# Patient Record
Sex: Male | Born: 1995 | Race: Black or African American | Hispanic: No | Marital: Single | Smoking: Current every day smoker
Health system: Southern US, Community
[De-identification: ages and names within clinical notes are randomized; demographics above are authoritative.]

## PROBLEM LIST (undated history)

## (undated) DIAGNOSIS — J45909 Unspecified asthma, uncomplicated: Secondary | ICD-10-CM

## (undated) HISTORY — DX: Unspecified asthma, uncomplicated: J45.909

---
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
## 2014-04-13 ENCOUNTER — Encounter (HOSPITAL_BASED_OUTPATIENT_CLINIC_OR_DEPARTMENT_OTHER): Payer: Self-pay | Admitting: Emergency Medicine

## 2014-04-13 ENCOUNTER — Emergency Department (HOSPITAL_BASED_OUTPATIENT_CLINIC_OR_DEPARTMENT_OTHER)
Admission: EM | Admit: 2014-04-13 | Discharge: 2014-04-13 | Disposition: A | Discharge status: Home / Self Care | Payer: Federal, State, Local not specified - PPO | Attending: Emergency Medicine | Admitting: Emergency Medicine

## 2014-04-13 ENCOUNTER — Emergency Department (HOSPITAL_BASED_OUTPATIENT_CLINIC_OR_DEPARTMENT_OTHER): Payer: Federal, State, Local not specified - PPO

## 2014-04-13 DIAGNOSIS — Y9389 Activity, other specified: Secondary | ICD-10-CM | POA: Insufficient documentation

## 2014-04-13 DIAGNOSIS — Z79899 Other long term (current) drug therapy: Secondary | ICD-10-CM | POA: Insufficient documentation

## 2014-04-13 DIAGNOSIS — R42 Dizziness and giddiness: Secondary | ICD-10-CM | POA: Insufficient documentation

## 2014-04-13 DIAGNOSIS — J45909 Unspecified asthma, uncomplicated: Secondary | ICD-10-CM | POA: Insufficient documentation

## 2014-04-13 DIAGNOSIS — M546 Pain in thoracic spine: Secondary | ICD-10-CM

## 2014-04-13 DIAGNOSIS — Y92410 Unspecified street and highway as the place of occurrence of the external cause: Secondary | ICD-10-CM | POA: Insufficient documentation

## 2014-04-13 DIAGNOSIS — S298XXA Other specified injuries of thorax, initial encounter: Secondary | ICD-10-CM | POA: Diagnosis not present

## 2014-04-13 DIAGNOSIS — IMO0002 Reserved for concepts with insufficient information to code with codable children: Secondary | ICD-10-CM | POA: Insufficient documentation

## 2014-04-13 DIAGNOSIS — R079 Chest pain, unspecified: Secondary | ICD-10-CM

## 2014-04-13 NOTE — Discharge Instructions (Signed)
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
## 2014-04-13 NOTE — ED Notes (Signed)
MD at bedside. 

## 2014-04-14 ENCOUNTER — Encounter (HOSPITAL_BASED_OUTPATIENT_CLINIC_OR_DEPARTMENT_OTHER): Payer: Self-pay | Admitting: Emergency Medicine

## 2014-04-14 ENCOUNTER — Emergency Department (HOSPITAL_BASED_OUTPATIENT_CLINIC_OR_DEPARTMENT_OTHER)
Admission: EM | Admit: 2014-04-14 | Discharge: 2014-04-14 | Disposition: A | Discharge status: Home / Self Care | Payer: Federal, State, Local not specified - PPO | Attending: Emergency Medicine | Admitting: Emergency Medicine

## 2014-04-14 DIAGNOSIS — Z79899 Other long term (current) drug therapy: Secondary | ICD-10-CM | POA: Insufficient documentation

## 2014-04-14 DIAGNOSIS — G8911 Acute pain due to trauma: Secondary | ICD-10-CM | POA: Diagnosis not present

## 2014-04-14 DIAGNOSIS — J45909 Unspecified asthma, uncomplicated: Secondary | ICD-10-CM | POA: Insufficient documentation

## 2014-04-14 DIAGNOSIS — R209 Unspecified disturbances of skin sensation: Secondary | ICD-10-CM | POA: Diagnosis present

## 2014-04-14 DIAGNOSIS — S161XXD Strain of muscle, fascia and tendon at neck level, subsequent encounter: Secondary | ICD-10-CM

## 2014-04-14 DIAGNOSIS — M542 Cervicalgia: Secondary | ICD-10-CM | POA: Insufficient documentation

## 2014-04-14 MED ORDER — CYCLOBENZAPRINE HCL 5 MG PO TABS: 5.0000 mg | ORAL_TABLET | Freq: Two times a day (BID) | ORAL | Status: DC | PRN
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
## 2014-04-14 NOTE — Discharge Instructions (Signed)
Cervical Sprain A cervical sprain is when the tissues (ligaments) that hold the neck bones in place stretch or tear. HOME CARE   Put ice on the injured area.  Put ice in a plastic bag.  Place a towel between your skin and the bag.  Leave the ice on for 15-20 minutes, 3-4 times a day.  You may have been given a collar to wear. This collar keeps your neck from moving while you heal.  Do not take the collar off unless told by your doctor.  If you have long hair, keep it outside of the collar.  Ask your doctor before changing the position of your collar. You may need to change its position over time to make it more comfortable.  If you are allowed to take off the collar for cleaning or bathing, follow your doctor's instructions on how to do it safely.  Keep your collar clean by wiping it with mild soap and water. Dry it completely. If the collar has removable pads, remove them every 1-2 days to hand wash them with soap and water. Allow them to air dry. They should be dry before you wear them in the collar.  Do not drive while wearing the collar.  Only take medicine as told by your doctor.  Keep all doctor visits as told.  Keep all physical therapy visits as told.  Adjust your work station so that you have good posture while you work.  Avoid positions and activities that make your problems worse.  Warm up and stretch before being active. GET HELP IF:  Your pain is not controlled with medicine.  You cannot take less pain medicine over time as planned.  Your activity level does not improve as expected. GET HELP RIGHT AWAY IF:   You are bleeding.  Your stomach is upset.  You have an allergic reaction to your medicine.  You develop new problems that you cannot explain.  You lose feeling (become numb) or you cannot move any part of your body (paralysis).  You have tingling or weakness in any part of your body.  Your symptoms get worse. Symptoms include:  Pain,  soreness, stiffness, puffiness (swelling), or a burning feeling in your neck.  Pain when your neck is touched.  Shoulder or upper back pain.  Limited ability to move your neck.  Headache.  Dizziness.  Your hands or arms feel week, lose feeling, or tingle.  Muscle spasms.  Difficulty swallowing or chewing. MAKE SURE YOU:   Understand these instructions.  Will watch your condition.  Will get help right away if you are not doing well or get worse. Document Released: 12/21/2007 Document Revised: 03/06/2013 Document Reviewed: 01/09/2013 Exit[employer] Patient Information 2015 [employer]. This information is not intended to replace advice given to you by your health care provider. Make sure you discuss any questions you have with your health care provider.  

## 2014-04-14 NOTE — ED Provider Notes (Signed)
Medical screening examination/treatment/procedure(s) were performed by non-physician practitioner and as supervising physician I was immediately available for consultation/collaboration.   EKG Interpretation None       Ethelda Chick, MD 04/14/14 1007

## 2014-04-14 NOTE — ED Notes (Addendum)
rx x 1 given for flexeril- follow up care discussed at length- referral given for Dr Pearletha Forge- pt verbalized understanding

## 2014-07-13 ENCOUNTER — Emergency Department (HOSPITAL_COMMUNITY): Payer: Federal, State, Local not specified - PPO

## 2014-07-13 ENCOUNTER — Emergency Department (HOSPITAL_COMMUNITY)
Admission: EM | Admit: 2014-07-13 | Discharge: 2014-07-14 | Disposition: A | Discharge status: Home / Self Care | Payer: Federal, State, Local not specified - PPO | Attending: Emergency Medicine | Admitting: Emergency Medicine

## 2014-07-13 ENCOUNTER — Encounter (HOSPITAL_COMMUNITY): Payer: Self-pay | Admitting: *Deleted

## 2014-07-13 DIAGNOSIS — S0267XA Fracture of alveolus of mandible, initial encounter for closed fracture: Secondary | ICD-10-CM | POA: Insufficient documentation

## 2014-07-13 DIAGNOSIS — J45909 Unspecified asthma, uncomplicated: Secondary | ICD-10-CM | POA: Diagnosis not present

## 2014-07-13 DIAGNOSIS — Y998 Other external cause status: Secondary | ICD-10-CM | POA: Insufficient documentation

## 2014-07-13 DIAGNOSIS — S0993XA Unspecified injury of face, initial encounter: Secondary | ICD-10-CM | POA: Diagnosis present

## 2014-07-13 DIAGNOSIS — S02609A Fracture of mandible, unspecified, initial encounter for closed fracture: Secondary | ICD-10-CM

## 2014-07-13 DIAGNOSIS — Y9289 Other specified places as the place of occurrence of the external cause: Secondary | ICD-10-CM | POA: Diagnosis not present

## 2014-07-13 DIAGNOSIS — Y9389 Activity, other specified: Secondary | ICD-10-CM | POA: Insufficient documentation

## 2014-07-13 DIAGNOSIS — R6884 Jaw pain: Secondary | ICD-10-CM | POA: Diagnosis not present

## 2014-07-13 MED ORDER — HYDROCODONE-ACETAMINOPHEN 5-325 MG PO TABS: 1.0000 | ORAL_TABLET | ORAL | Status: DC | PRN

## 2014-07-13 MED ORDER — HYDROCODONE-ACETAMINOPHEN 5-325 MG PO TABS
1.0000 | ORAL_TABLET | Freq: Once | ORAL | Status: AC
  Administered 2014-07-13: 1 via ORAL
  Filled 2014-07-13: qty 1
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
## 2014-07-13 NOTE — ED Notes (Signed)
Patient back from CT Patient remains in NAD 

## 2014-07-13 NOTE — Discharge Instructions (Signed)
Mandibular Fracture °A mandibular fracture is a break in the jawbone. °CAUSES  °The most common cause of mandibular fracture is a direct blow (trauma) to the jaw. This could happen from: °· A car crash. °· Physical violence. °· A fall from a high place. °SYMPTOMS  °· Pain. °· Swelling. °· Difficulty and pain when closing the mouth. °· Feeling that the teeth are not aligned properly when closing the mouth (malocclusion). °· Difficulty speaking. °· Difficulty swallowing. °DIAGNOSIS  °Your caregiver will take your history and perform a physical exam. He or she may also order imaging tests, such as X-rays or a computed tomography (CT) scan, to confirm your diagnosis. °TREATMENT  °Surgery is often needed to put the jaw back in the right position. Wires are usually placed around the teeth to hold the jaw in place while it heals. Treatment may also include pain medicine, ice, and a soft or liquid diet. °HOME CARE INSTRUCTIONS  °· Put ice on the injured area. °¨ Put ice in a plastic bag. °¨ Place a towel between your skin and the bag. °¨ Leave the ice on for 15-20 minutes, 03-04 times a day for the first 2 days. °· Only take over-the-counter or prescription medicines for pain, discomfort, or fever as directed by your caregiver. °· Eat a well-balanced, high-protein soft or liquid diet as directed by your caregiver. °· If your jaws are wired, follow your caregiver's instructions for wired jaw care. °· Sleep on your back to avoid putting pressure on your jaw. °· Avoid exercising to the point that you become short of breath. °SEEK MEDICAL CARE IF:  °· You have a severe headache or numbness in your face. °· You have severe jaw pain that is not relieved with medicine. °· Your jaw wires become loose. °· You have uncontrollable nausea or anxiety. °· Your swelling or redness gets worse. °SEEK IMMEDIATE MEDICAL CARE IF: °· You have a fever. °· You have difficulty breathing. °· You feel like your airway is tightening. °· You cannot  swallow your saliva. °· You make a high-pitched whistling sound when you breathe (wheezing). °MAKE SURE YOU:  °· Understand these instructions. °· Will watch your condition. °· Will get help right away if you are not doing well or get worse. °Document Released: 07/04/2005 Document Revised: 09/26/2011 Document Reviewed: 07/20/2011 °ExitCare® Patient Information ©2015 Exit[employer], LLC. This information is not intended to replace advice given to you by your health care provider. Make sure you discuss any questions you have with your health care provider. ° °
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
## 2014-07-13 NOTE — ED Notes (Signed)
Patient transported to CT 

## 2014-07-14 ENCOUNTER — Encounter (HOSPITAL_BASED_OUTPATIENT_CLINIC_OR_DEPARTMENT_OTHER): Payer: Self-pay

## 2014-07-14 ENCOUNTER — Emergency Department (HOSPITAL_BASED_OUTPATIENT_CLINIC_OR_DEPARTMENT_OTHER)
Admission: EM | Admit: 2014-07-14 | Discharge: 2014-07-14 | Disposition: A | Discharge status: Home / Self Care | Payer: Federal, State, Local not specified - PPO | Attending: Emergency Medicine | Admitting: Emergency Medicine

## 2014-07-14 DIAGNOSIS — Z8781 Personal history of (healed) traumatic fracture: Secondary | ICD-10-CM | POA: Diagnosis not present

## 2014-07-14 DIAGNOSIS — J45909 Unspecified asthma, uncomplicated: Secondary | ICD-10-CM | POA: Insufficient documentation

## 2014-07-14 DIAGNOSIS — R6884 Jaw pain: Secondary | ICD-10-CM | POA: Insufficient documentation

## 2014-07-14 DIAGNOSIS — G8911 Acute pain due to trauma: Secondary | ICD-10-CM | POA: Insufficient documentation

## 2014-07-14 MED ORDER — HYDROMORPHONE HCL 1 MG/ML IJ SOLN
1.0000 mg | Freq: Once | INTRAMUSCULAR | Status: AC
  Administered 2014-07-14: 1 mg via INTRAVENOUS
  Filled 2014-07-14: qty 1

## 2014-07-14 MED ORDER — HYDROCODONE-ACETAMINOPHEN 5-325 MG PO TABS
2.0000 | ORAL_TABLET | Freq: Once | ORAL | Status: AC
  Administered 2014-07-14: 2 via ORAL
  Filled 2014-07-14: qty 2

## 2014-07-14 NOTE — ED Notes (Signed)
Discharge instructions reviewed with patient--agrees and verbalized understanding Patient informed of need to make and keep follow up appointment--agrees and verbalized understanding DC Rx x 1 reviewed with patient--agrees and verbalized understanding VS updated and stable--reviewed with patient at time of DC--agrees and verbalized understanding Patient alert and oriented x 4 and in NAD at time of discharge All questions related to this ED visit, DC instructions, DC prescriptions and follow up care answered to patient's satisfaction by this nurse 
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
## 2014-07-14 NOTE — Discharge Instructions (Signed)
Follow-up with [facility]ology in the [facility]. [facility] at: Address: [address], [place], [place] [address] [phone]  Return to the ER with any worsening of symptoms, severe swelling, difficulty swallowing or breathing, high fever, nausea, vomiting.  Mandibular Fracture A mandibular fracture is a break in the jawbone. CAUSES  The most common cause of mandibular fracture is a direct blow (trauma) to the jaw. This could happen from:  A car crash.  Physical violence.  A fall from a high place. SYMPTOMS   Pain.  Swelling.  Difficulty and pain when closing the mouth.  Feeling that the teeth are not aligned properly when closing the mouth (malocclusion).  Difficulty speaking.  Difficulty swallowing. DIAGNOSIS  Your caregiver will take your history and perform a physical exam. He or she may also order imaging tests, such as X-rays or a computed tomography (CT) scan, to confirm your diagnosis. TREATMENT  Surgery is often needed to put the jaw back in the right position. Wires are usually placed around the teeth to hold the jaw in place while it heals. Treatment may also include pain medicine, ice, and a soft or liquid diet. HOME CARE INSTRUCTIONS   Put ice on the injured area.  Put ice in a plastic bag.  Place a towel between your skin and the bag.  Leave the ice on for 15-20 minutes, 03-04 times a day for the first 2 days.  Only take over-the-counter or prescription medicines for pain, discomfort, or fever as directed by your caregiver.  Eat a well-balanced, high-protein soft or liquid diet as directed by your caregiver.  If your jaws are wired, follow your caregiver's instructions for wired jaw care.  Sleep on your back to avoid putting pressure on your jaw.  Avoid exercising to the point that you become short of breath. SEEK MEDICAL CARE IF:   You have a severe headache or  numbness in your face.  You have severe jaw pain that is not relieved with medicine.  Your jaw wires become loose.  You have uncontrollable nausea or anxiety.  You  r swelling or redness gets worse. SEEK IMMEDIATE MEDICAL CARE IF:  You have a fever.  You have difficulty breathing.  You feel like your airway is tightening.  You cannot swallow your saliva.  You make a high-pitched whistling sound when you breathe (wheezing). MAKE SURE YOU:   Understand these instructions.  Will watch your condition.  Will get help right away if you are not doing well or get worse. Document Released: 07/04/2005 Document Revised: 09/26/2011 Document Reviewed: 07/20/2011 [facility] Patient Information 2015 [place], [place]. This information is not intended to replace advice given to you by your health care provider. Make sure you discuss any questions you have with your health care provider.  Fractured-Jaw Meal Plan The purpose of the fractured-jaw meal plan is to provide foods that can be easily blended and easily swallowed. This plan is typically used after jaw or mouth surgery, wired jaw surgery, or dental surgery. Foods in this plan need to be blended so that they can be sipped from a straw or given through a syringe. You should try to have at least three meals and three snacks daily. It is important to make sure you get enough calories and protein to prevent weight loss and help your body heal, especially after surgery. You may wish to include a liquid multivitamin in your plan to ensure that you get  all the vitamins and minerals you need. Ask your health care provider for a recommendation.  HOW DO I PREPARE MY MEALS? All foods in this plan must be blended. Avoid nuts, seeds, skins, peels, bones, or any foods that cannot be blended to the right consistency. Make sure to eat a variety of foods from each food group every day. The following tips can help you as you blend your food:  Remove skins,  seeds, and peels from food.  Cook meats and vegetables thoroughly.  Cut foods into small pieces and mix with a small amount of liquid in a food processor or blender. Continue to add liquid until the food becomes thin enough to sip through a straw.  Adding liquids such as juice, milk, cream, broth, gravy, or vegetable juice can help add flavor to foods.  Heat foods after they have been blended to reduce the amount of foam created from blending.  Heat or cool your foods to lukewarm temperatures if your teeth and mouth are sensitive to extreme temperatures. WHAT FOODS CAN I EAT? Make sure to eat a variety of foods from each food group.  Grains  Hot cereals, such as oatmeal, grits, ground wheat cereals, and polenta.  Rice and pasta.  Couscous. Vegetables  All cooked or canned vegetables, without seeds and skins.  Vegetable juices.  Cooked potatoes, without skins. Fruit  Any cooked or canned fruits, without seeds and skins.  Fresh, peeled soft fruits, such as bananas and peaches, that can be blended until smooth.  All fruit juices, without seeds and skins. Meat and Other Protein Sources  Soft-boiled eggs, scrambled eggs, powdered eggs, pasteurized egg mixtures, and custard.  Ground meats, such as hamburger, Mal[place]urkey, sausage, and meatloaf.  Tender, well-cooked meat, poultry, and fish prepared without bones or skin.  Soft soy foods (such as tofu).  Smooth nut butters. Dairy  All are allowed. Beverages  Coffee (regular or decaffeinated), tea, and mineral water. Condiments  All seasonings and condiments that blend well. WHEN MAY I NEED TO SUPPLEMENT MY MEALS? If you begin to lose weight on this plan, you may need to increase the amount of food you are eating or the number of calories in your food or both. You can increase the number of calories by adding any of the following foods:  Protein powder or powdered milk.  Extra fats, such as margarine (without trans fat),  sour cream, cream cheese, cream, and nut butters, such as peanut butter or almond butter.  Sweets, such as honey, ice cream, blackstrap molasses, or sugar. Document Released: 12/22/2009 Document Revised: 11/18/2013 Document Reviewed: 05/31/2013 [facility] Patient Information 2015 [place]Care, [place]. This information is not intended to replace advice given to you by your health care provider. Make sure you discuss any questions you have with your health care provider.

## 2014-07-14 NOTE — ED Notes (Signed)
[facility].  [facility] and now pain is worse and radiating down right neck.  Pt has swelling to right side of his face.

## 2014-07-14 NOTE — ED Notes (Signed)
Entered patient room to DC Patient began crying and moving around all over the stretcher Patient then began to yell, "I can't move my neck." Dr. Lynelle DoctorKnapp present in doorway during this episode Per MD, patient to be given IM pain medication and then DC

## 2014-07-14 NOTE — ED Notes (Signed)
[facility] [place] both state you have to have 2 forms of ID , pt states he does not have and id

## 2014-07-17 ENCOUNTER — Emergency Department (HOSPITAL_BASED_OUTPATIENT_CLINIC_OR_DEPARTMENT_OTHER)
Admission: EM | Admit: 2014-07-17 | Discharge: 2014-07-17 | Disposition: A | Discharge status: Home / Self Care | Payer: Federal, State, Local not specified - PPO | Attending: Emergency Medicine | Admitting: Emergency Medicine

## 2014-07-17 ENCOUNTER — Encounter (HOSPITAL_BASED_OUTPATIENT_CLINIC_OR_DEPARTMENT_OTHER): Payer: Self-pay | Admitting: *Deleted

## 2014-07-17 DIAGNOSIS — J45909 Unspecified asthma, uncomplicated: Secondary | ICD-10-CM | POA: Insufficient documentation

## 2014-07-17 DIAGNOSIS — S02609D Fracture of mandible, unspecified, subsequent encounter for fracture with routine healing: Secondary | ICD-10-CM

## 2014-07-17 DIAGNOSIS — R220 Localized swelling, mass and lump, head: Secondary | ICD-10-CM | POA: Diagnosis present

## 2014-07-17 DIAGNOSIS — X58XXXD Exposure to other specified factors, subsequent encounter: Secondary | ICD-10-CM | POA: Insufficient documentation

## 2014-07-17 DIAGNOSIS — S0269XD Fracture of mandible of other specified site, subsequent encounter for fracture with routine healing: Secondary | ICD-10-CM | POA: Diagnosis not present

## 2014-07-17 MED ORDER — HYDROCODONE-ACETAMINOPHEN 5-325 MG PO TABS: 1.0000 | ORAL_TABLET | ORAL | Status: DC | PRN

## 2014-07-17 MED ORDER — OXYCODONE-ACETAMINOPHEN 5-325 MG PO TABS
1.0000 | ORAL_TABLET | Freq: Once | ORAL | Status: AC
  Administered 2014-07-17: 1 via ORAL
  Filled 2014-07-17: qty 1
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
## 2014-07-17 NOTE — Discharge Instructions (Signed)
Return to the ED with any concerns including difficulty breathing or swallowing, or any other alarming symptoms  You need to be sure to call to make the followup appointment as advised at your initial visi

## 2014-07-17 NOTE — ED Notes (Signed)
Pt's meds purchased for him and cash given to him by this RN. Pt's options for f/u discussed. Pt in agreement with plan but made no attempt to get out of bed. When I stated, "I need you to collect your belongings and come with me.", the patient grabbed the right side of his neck and began crying and stating "I can't move the right side of my neck." Pt moving all extremities well and turning head from side to side. Pt attempted to stand up and then laid down across the foot of the stretcher and refused to get up. Dr. Karma GanjaLinker called to bedside. Pt continued to refuse to get off of the bed or leave the ER. Security called to bedside. Dr. Karma GanjaLinker reports the patient is to be discharged. Pt eventually moved himself to a wheelchair with minimal assistance and wheeled out by security and EMT.
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
## 2014-07-17 NOTE — ED Notes (Signed)
The IV documented at 10:59 was documented on the wrong pt. Do not charge this pt for an IV.

## 2014-07-17 NOTE — ED Notes (Addendum)
Pt c/o right side facial swelling. He was seen at [facility] days ago, found to have right mandible fx, and received an Rx for vicodin. He sts that the swelling has gotten worse. Pt was seen here on the 28, pain was treated and he was d/c'ed home. Pt sts he was here today b/c he didn't know who to see to get it fixed. I explained to pt that he needed to follow up with the referral given at initial visit. Pt is concerned b/c he has no insurance or money for specialist.

## 2014-09-26 ENCOUNTER — Emergency Department (HOSPITAL_BASED_OUTPATIENT_CLINIC_OR_DEPARTMENT_OTHER)
Admission: EM | Admit: 2014-09-26 | Discharge: 2014-09-26 | Disposition: A | Discharge status: Home / Self Care | Payer: Federal, State, Local not specified - PPO | Attending: Emergency Medicine | Admitting: Emergency Medicine

## 2014-09-26 ENCOUNTER — Encounter (HOSPITAL_BASED_OUTPATIENT_CLINIC_OR_DEPARTMENT_OTHER): Payer: Self-pay | Admitting: *Deleted

## 2014-09-26 DIAGNOSIS — J45909 Unspecified asthma, uncomplicated: Secondary | ICD-10-CM | POA: Diagnosis not present

## 2014-09-26 DIAGNOSIS — L0201 Cutaneous abscess of face: Secondary | ICD-10-CM | POA: Diagnosis not present

## 2014-09-26 DIAGNOSIS — K088 Other specified disorders of teeth and supporting structures: Secondary | ICD-10-CM | POA: Diagnosis present

## 2014-09-26 MED ORDER — LIDOCAINE-EPINEPHRINE 2 %-1:100000 IJ SOLN
20.0000 mL | Freq: Once | INTRAMUSCULAR | Status: AC
  Administered 2014-09-26: 20 mL via INTRADERMAL

## 2014-09-26 MED ORDER — SULFAMETHOXAZOLE-TRIMETHOPRIM 800-160 MG PO TABS: 1.0000 | ORAL_TABLET | Freq: Two times a day (BID) | ORAL | Status: DC

## 2014-09-26 MED ORDER — LIDOCAINE-EPINEPHRINE 2 %-1:100000 IJ SOLN
INTRAMUSCULAR | Status: AC
  Filled 2014-09-26: qty 1

## 2014-09-26 NOTE — Discharge Instructions (Signed)
Abscess °An abscess (boil or furuncle) is an infected area on or under the skin. This area is filled with yellowish-white fluid (pus) and other material (debris). °HOME CARE  °· Only take medicines as told by your doctor. °· If you were given antibiotic medicine, take it as directed. Finish the medicine even if you start to feel better. °· If gauze is used, follow your doctor's directions for changing the gauze. °· To avoid spreading the infection: °· Keep your abscess covered with a bandage. °· Wash your hands well. °· Do not share personal care items, towels, or whirlpools with others. °· Avoid skin contact with others. °· Keep your skin and clothes clean around the abscess. °· Keep all doctor visits as told. °GET HELP RIGHT AWAY IF:  °· You have more pain, puffiness (swelling), or redness in the wound site. °· You have more fluid or blood coming from the wound site. °· You have muscle aches, chills, or you feel sick. °· You have a fever. °MAKE SURE YOU:  °· Understand these instructions. °· Will watch your condition. °· Will get help right away if you are not doing well or get worse. °Document Released: 12/21/2007 Document Revised: 01/03/2012 Document Reviewed: 09/16/2011 °ExitCare® Patient Information ©2015 [employer]. This information is not intended to replace advice given to you by your health care provider. Make sure you discuss any questions you have with your health care provider. ° °Abscess °Care After °An abscess (also called a boil or furuncle) is an infected area that contains a collection of pus. Signs and symptoms of an abscess include pain, tenderness, redness, or hardness, or you may feel a moveable soft area under your skin. An abscess can occur anywhere in the body. The infection may spread to surrounding tissues causing cellulitis. A cut (incision) by the surgeon was made over your abscess and the pus was drained out. Gauze may have been packed into the space to provide a drain that will  allow the cavity to heal from the inside outwards. The boil may be painful for 5 to 7 days. Most people with a boil do not have high fevers. Your abscess, if seen early, may not have localized, and may not have been lanced. If not, another appointment may be required for this if it does not get better on its own or with medications. °HOME CARE INSTRUCTIONS  °· Only take over-the-counter or prescription medicines for pain, discomfort, or fever as directed by your caregiver. °· When you bathe, soak and then remove gauze or iodoform packs at least daily or as directed by your caregiver. You may then wash the wound gently with mild soapy water. Repack with gauze or do as your caregiver directs. °SEEK IMMEDIATE MEDICAL CARE IF:  °· You develop increased pain, swelling, redness, drainage, or bleeding in the wound site. °· You develop signs of generalized infection including muscle aches, chills, fever, or a general ill feeling. °· An oral temperature above 102° F (38.9° C) develops, not controlled by medication. °See your caregiver for a recheck if you develop any of the symptoms described above. If medications (antibiotics) were prescribed, take them as directed. °Document Released: 01/20/2005 Document Revised: 09/26/2011 Document Reviewed: 09/17/2007 °ExitCare® Patient Information ©2015 [employer]. This information is not intended to replace advice given to you by your health care provider. Make sure you discuss any questions you have with your health care provider. ° °

## 2014-09-26 NOTE — ED Notes (Signed)
Swelling to his right jaw for 2 weeks. Hx of recent jaw fracture.

## 2014-09-26 NOTE — ED Provider Notes (Addendum)
CSN: [id number]     Arrival date & time 09/26/14  1507 History   First MD Initiated Contact with Patient 09/26/14 1707     Chief Complaint  Patient presents with  . Dental Pain     (Consider location/radiation/quality/duration/timing/severity/associated sxs/prior Treatment) Patient is a 19 y.o. male presenting with abscess. The history is provided by the patient.  Abscess Location:  Face Facial abscess location:  Face Abscess quality: fluctuance, induration, painful, redness and warmth   Red streaking: no   Duration:  3 days Progression:  Worsening Pain details:    Quality:  Sharp and throbbing   Severity:  Moderate   Timing:  Constant   Progression:  Worsening Chronicity:  New Context: not immunosuppression and not injected drug use   Relieved by:  Nothing Worsened by:  Draining/squeezing Ineffective treatments:  None tried Associated symptoms: no fever, no headaches, no nausea and no vomiting   Risk factors: no hx of MRSA and no prior abscess     Past Medical History  Diagnosis Date  . Asthma    History reviewed. No pertinent past surgical history. Family History  Problem Relation Age of Onset  . Cancer Mother     cervical  . Hypertension Father    History  Substance Use Topics  . Smoking status: Never Smoker   . Smokeless tobacco: Former NeurosurgeonUser    Quit date: 05/07/2013     Comment: Pt chewed tobacco.  . Alcohol Use: 1.0 - 1.5 oz/week    2-3 drink(s) per week    Review of Systems  Constitutional: Negative for fever.  Gastrointestinal: Negative for nausea and vomiting.  Neurological: Negative for headaches.  All other systems reviewed and are negative.     Allergies  Review of patient's allergies indicates no known allergies.  Home Medications   Prior to Admission medications   Medication Sig Start Date End Date Taking? Authorizing Provider  HYDROcodone-acetaminophen (NORCO/VICODIN) 5-325 MG per tablet Take 1-2 tablets by mouth every 4 (four) hours  as needed. 07/17/14   Jerelyn ScottMartha Linker, MD   BP 112/95 mmHg  Pulse 80  Temp(Src) 98.5 F (36.9 C) (Oral)  Resp 18  Ht 5\' 10"  (1.778 m)  Wt 150 lb (68.04 kg)  BMI 21.52 kg/m2  SpO2 100% Physical Exam  Constitutional: He is oriented to person, place, and time. He appears well-developed and well-nourished. No distress.  HENT:  Head: Normocephalic and atraumatic.    Mouth/Throat: Uvula is midline. No oral lesions. No trismus in the jaw. No dental abscesses, uvula swelling or dental caries.  Eyes: EOM are normal. Pupils are equal, round, and reactive to light.  Cardiovascular: Normal rate.   Pulmonary/Chest: Effort normal.  Neurological: He is alert and oriented to person, place, and time.  Skin: Skin is warm and dry.  Psychiatric: He has a normal mood and affect. His behavior is normal.  Nursing note and vitals reviewed.   ED Course  Procedures (including critical care time) Labs Review Labs Reviewed - No data to display  Imaging Review No results found.   EKG Interpretation None     INCISION AND DRAINAGE Performed by: Gwyneth SproutPLUNKETT,Liadan Guizar Consent: Verbal consent obtained. Risks and benefits: risks, benefits and alternatives were discussed Type: abscess  Body area: face  Anesthesia: local infiltration  Incision was made with a 18g  Local anesthetic: lidocaine 2% with epinephrine  Anesthetic total: 1 ml  Complexity: simple  Drainage: purulent  Drainage amount: 5mL  Packing material: none  Patient tolerance: Patient tolerated the  procedure well with no immediate complications.    MDM   Final diagnoses:  Facial abscess    Patient with a facial abscess that was drained as above. Due to mild erythema we'll treat with Bactrim.    Gwyneth Sprout, MD 09/26/14 1754  Gwyneth Sprout, MD 09/26/14 1755

## 2014-11-05 ENCOUNTER — Emergency Department (HOSPITAL_BASED_OUTPATIENT_CLINIC_OR_DEPARTMENT_OTHER): Payer: Federal, State, Local not specified - PPO

## 2014-11-05 ENCOUNTER — Emergency Department (HOSPITAL_BASED_OUTPATIENT_CLINIC_OR_DEPARTMENT_OTHER)
Admission: EM | Admit: 2014-11-05 | Discharge: 2014-11-05 | Disposition: A | Discharge status: Home / Self Care | Payer: Federal, State, Local not specified - PPO | Attending: Emergency Medicine | Admitting: Emergency Medicine

## 2014-11-05 ENCOUNTER — Encounter (HOSPITAL_BASED_OUTPATIENT_CLINIC_OR_DEPARTMENT_OTHER): Payer: Self-pay

## 2014-11-05 DIAGNOSIS — Y998 Other external cause status: Secondary | ICD-10-CM | POA: Insufficient documentation

## 2014-11-05 DIAGNOSIS — S29012A Strain of muscle and tendon of back wall of thorax, initial encounter: Secondary | ICD-10-CM | POA: Diagnosis not present

## 2014-11-05 DIAGNOSIS — J45909 Unspecified asthma, uncomplicated: Secondary | ICD-10-CM | POA: Insufficient documentation

## 2014-11-05 DIAGNOSIS — X58XXXA Exposure to other specified factors, initial encounter: Secondary | ICD-10-CM | POA: Diagnosis not present

## 2014-11-05 DIAGNOSIS — Y9389 Activity, other specified: Secondary | ICD-10-CM | POA: Insufficient documentation

## 2014-11-05 DIAGNOSIS — S29019A Strain of muscle and tendon of unspecified wall of thorax, initial encounter: Secondary | ICD-10-CM

## 2014-11-05 DIAGNOSIS — Z791 Long term (current) use of non-steroidal anti-inflammatories (NSAID): Secondary | ICD-10-CM | POA: Insufficient documentation

## 2014-11-05 DIAGNOSIS — Y9289 Other specified places as the place of occurrence of the external cause: Secondary | ICD-10-CM | POA: Insufficient documentation

## 2014-11-05 DIAGNOSIS — Z720 Tobacco use: Secondary | ICD-10-CM | POA: Insufficient documentation

## 2014-11-05 DIAGNOSIS — S299XXA Unspecified injury of thorax, initial encounter: Secondary | ICD-10-CM | POA: Diagnosis present

## 2014-11-05 MED ORDER — NAPROXEN 500 MG PO TABS: 500.0000 mg | ORAL_TABLET | Freq: Two times a day (BID) | ORAL | Status: AC

## 2014-11-05 MED ORDER — KETOROLAC TROMETHAMINE 60 MG/2ML IM SOLN
60.0000 mg | Freq: Once | INTRAMUSCULAR | Status: AC
  Administered 2014-11-05: 60 mg via INTRAMUSCULAR
  Filled 2014-11-05: qty 2

## 2014-11-05 MED ORDER — HYDROCODONE-ACETAMINOPHEN 5-325 MG PO TABS
1.0000 | ORAL_TABLET | Freq: Once | ORAL | Status: AC
  Administered 2014-11-05: 1 via ORAL
  Filled 2014-11-05: qty 1

## 2014-11-05 MED ORDER — HYDROCODONE-ACETAMINOPHEN 5-325 MG PO TABS: 1.0000 | ORAL_TABLET | Freq: Four times a day (QID) | ORAL | Status: AC | PRN

## 2014-11-05 NOTE — ED Provider Notes (Signed)
CSN: [id number]     Arrival date & time 11/05/14  0714 History   First MD Initiated Contact with Patient 11/05/14 0756     Chief Complaint  Patient presents with  . Rib Injury     (Consider location/radiation/quality/duration/timing/severity/associated sxs/prior Treatment) HPI Comments: Patient presents with sudden onset upper thoracic back pain and anterior chest pain after lifting a 280 pound barrel of trash into a dumpster yesterday afternoon.  He's had some associated shortness of breath.  Chart positions make it better.  Certain positions make it worse.  Sometimes he has a pleuritic pain.  He has had no numbness or weakness.  No prior back injuries.  He otherwise has no medical problems.  He has not tried anything for pain other than a hot shower   Past Medical History  Diagnosis Date  . Asthma    History reviewed. No pertinent past surgical history. Family History  Problem Relation Age of Onset  . Cancer Mother     cervical  . Hypertension Father    History  Substance Use Topics  . Smoking status: Current Every Day Smoker -- 0.50 packs/day for 3 years    Types: Cigarettes  . Smokeless tobacco: Former NeurosurgeonUser    Quit date: 05/07/2013     Comment: Pt chewed tobacco.  . Alcohol Use: 1.0 - 1.5 oz/week    2-3 Standard drinks or equivalent per week     Comment: occasional    Review of Systems  Constitutional: Negative for fever, activity change, appetite change and fatigue.  HENT: Negative for congestion, facial swelling, rhinorrhea and trouble swallowing.   Eyes: Negative for photophobia and pain.  Respiratory: Negative for cough, chest tightness and shortness of breath.   Cardiovascular: Positive for chest pain. Negative for leg swelling.  Gastrointestinal: Negative for nausea, vomiting, abdominal pain, diarrhea and constipation.  Endocrine: Negative for polydipsia and polyuria.  Genitourinary: Negative for dysuria, urgency, decreased urine volume and difficulty urinating.    Musculoskeletal: Positive for back pain. Negative for gait problem.  Skin: Negative for color change, rash and wound.  Allergic/Immunologic: Negative for immunocompromised state.  Neurological: Negative for dizziness, facial asymmetry, speech difficulty, weakness, numbness and headaches.  Psychiatric/Behavioral: Negative for confusion, decreased concentration and agitation.      Allergies  Review of patient's allergies indicates no known allergies.  Home Medications   Prior to Admission medications   Medication Sig Start Date End Date Taking? Authorizing Provider  HYDROcodone-acetaminophen (NORCO) 5-325 MG per tablet Take 1 tablet by mouth every 6 (six) hours as needed for severe pain (For pain not controlled first by Naproxen). 11/05/14   Toy CookeyMegan Docherty, MD  naproxen (NAPROSYN) 500 MG tablet Take 1 tablet (500 mg total) by mouth 2 (two) times daily with a meal. 11/05/14   Toy CookeyMegan Docherty, MD   BP 136/67 mmHg  Pulse 83  Temp(Src) 97.9 F (36.6 C) (Oral)  Resp 16  Ht 5\' 11"  (1.803 m)  Wt 150 lb (68.04 kg)  BMI 20.93 kg/m2  SpO2 100% Physical Exam  Constitutional: He is oriented to person, place, and time. He appears well-developed and well-nourished. No distress.  HENT:  Head: Normocephalic and atraumatic.  Mouth/Throat: No oropharyngeal exudate.  Eyes: Pupils are equal, round, and reactive to light.  Neck: Normal range of motion. Neck supple.  Cardiovascular: Normal rate, regular rhythm and normal heart sounds.  Exam reveals no gallop and no friction rub.   No murmur heard. Pulmonary/Chest: Effort normal and breath sounds normal. No respiratory distress.  He has no wheezes. He has no rales.    Abdominal: Soft. Bowel sounds are normal. He exhibits no distension and no mass. There is no tenderness. There is no rebound and no guarding.  Musculoskeletal: Normal range of motion. He exhibits no edema or tenderness.  Neurological: He is alert and oriented to person, place, and time.   Skin: Skin is warm and dry.  Psychiatric: He has a normal mood and affect.    ED Course  Procedures (including critical care time) Labs Review Labs Reviewed - No data to display  Imaging Review Dg Chest 2 View  11/05/2014   CLINICAL DATA:  Right-sided thoracic back pain after lifting heavy object yesterday.  EXAM: CHEST  2 VIEW  COMPARISON:  04/13/2014  FINDINGS: The heart size and mediastinal contours are within normal limits. Both lungs are clear. There is a minimal upper thoracic scoliosis centered at T5 with convexity to left, stable.  IMPRESSION: No significant abnormality.   Electronically Signed   By: Francene Boyers M.D.   On: 11/05/2014 09:01   Dg Thoracic Spine 2 View  11/05/2014   CLINICAL DATA:  Mid thoracic pain after lifting heavy object yesterday.  EXAM: THORACIC SPINE - 2 VIEW  COMPARISON:  Chest x-ray dated 04/13/2014  FINDINGS: There is no evidence of thoracic spine fracture. Alignment is normal. No other significant bone abnormalities are identified.  IMPRESSION: Normal exam.   Electronically Signed   By: Francene Boyers M.D.   On: 11/05/2014 08:59     EKG Interpretation None      MDM   Final diagnoses:  Strain of thoracic region, initial encounter    Pt is a 19 y.o. male with Pmhx as above who presents with sudden onset thoracic back pain and chest pain yesterday around 2 PM after lifting a heavy trash barrel, approximate 280 pounds into a dumpster yesterday.  The pain before.  No onset of numbness or weakness.  He has not tried anything at home.  He has had some shortness of breath and sometimes has a pleuritic pain.  On physical exam, vital signs are stable.  Patient is in no acute distress.  Lungs are clear.  He is reproducible upper thoracic pain.  No reproducible chest wall pain.  Chest x-ray is normal.  Thoracic x-rays also normal.  Suspect thoracic muscle strain.  We'll place on naproxen with Norco for breakthrough pain     Valla Leaver Pitre evaluation in the  Emergency Department is complete. It has been determined that no acute conditions requiring further emergency intervention are present at this time. The patient/guardian have been advised of the diagnosis and plan. We have discussed signs and symptoms that warrant return to the ED, such as changes or worsening in symptoms, worsening pain, numbness, weakness, shortness of breath.       Toy Cookey, MD 11/05/14 [phone]

## 2014-11-05 NOTE — ED Notes (Signed)
Right rib pain and back pain after lifting trash into a dumpster yesterday.  Pain with taking a deep breath and movement.

## 2014-11-05 NOTE — Discharge Instructions (Signed)
Thoracic Strain You have injured the muscles or tendons that attach to the upper part of your back behind your chest. This injury is called a thoracic strain, thoracic sprain, or mid-back strain.  CAUSES  The cause of thoracic strain varies. A less severe injury involves pulling a muscle or tendon without tearing it. A more severe injury involves tearing (rupturing) a muscle or tendon. With less severe injuries, there may be little loss of strength. Sometimes, there are breaks (fractures) in the bones to which the muscles are attached. These fractures are rare, unless there was a direct hit (trauma) or you have weak bones due to osteoporosis or age. Longstanding strains may be caused by overuse or improper form during certain movements. Obesity can also increase your risk for back injuries. Sudden strains may occur due to injury or not warming up properly before exercise. Often, there is no obvious cause for a thoracic strain. SYMPTOMS  The main symptom is pain, especially with movement, such as during exercise. DIAGNOSIS  Your caregiver can usually tell what is wrong by taking an X-ray and doing a physical exam. TREATMENT   Physical therapy may be helpful for recovery. Your caregiver can give you exercises to do or refer you to a physical therapist after your pain improves.  After your pain improves, strengthening and conditioning programs appropriate for your sport or occupation may be helpful.  Always warm up before physical activities or athletics. Stretching after physical activity may also help.  Certain over-the-counter medicines may also help. Ask your caregiver if there are medicines that would help you. If this is your first thoracic strain injury, proper care and proper healing time before starting activities should prevent long-term problems. Torn ligaments and tendons require as long to heal as broken bones. Average healing times may be only 1 week for a mild strain. For torn muscles  and tendons, healing time may be up to 6 weeks to 2 months. HOME CARE INSTRUCTIONS   Apply ice to the injured area. Ice massages may also be used as directed.  Put ice in a plastic bag.  Place a towel between your skin and the bag.  Leave the ice on for 15-20 minutes, 03-04 times a day, for the first 2 days.  Only take over-the-counter or prescription medicines for pain, discomfort, or fever as directed by your caregiver.  Keep your appointments for physical therapy if this was prescribed.  Use wraps and back braces as instructed. SEEK IMMEDIATE MEDICAL CARE IF:   You have an increase in bruising, swelling, or pain.  Your pain has not improved with medicines.  You develop new shortness of breath, chest pain, or fever.  Problems seem to be getting worse rather than better. MAKE SURE YOU:   Understand these instructions.  Will watch your condition.  Will get help right away if you are not doing well or get worse. Document Released: 09/24/2003 Document Revised: 09/26/2011 Document Reviewed: 08/20/2010 Exit[employer] Patient Information 2015 [employer]. This information is not intended to replace advice given to you by your health care provider. Make sure you discuss any questions you have with your health care provider.  

## 2015-09-24 IMAGING — CR DG CHEST 2V
2 series · 2 of 2 positions shown · non-contrast
Comparison: 04/13/2014

CLINICAL DATA: Right-sided thoracic back pain after lifting heavy
object yesterday.

EXAM:
CHEST  2 VIEW

[w chest pa]
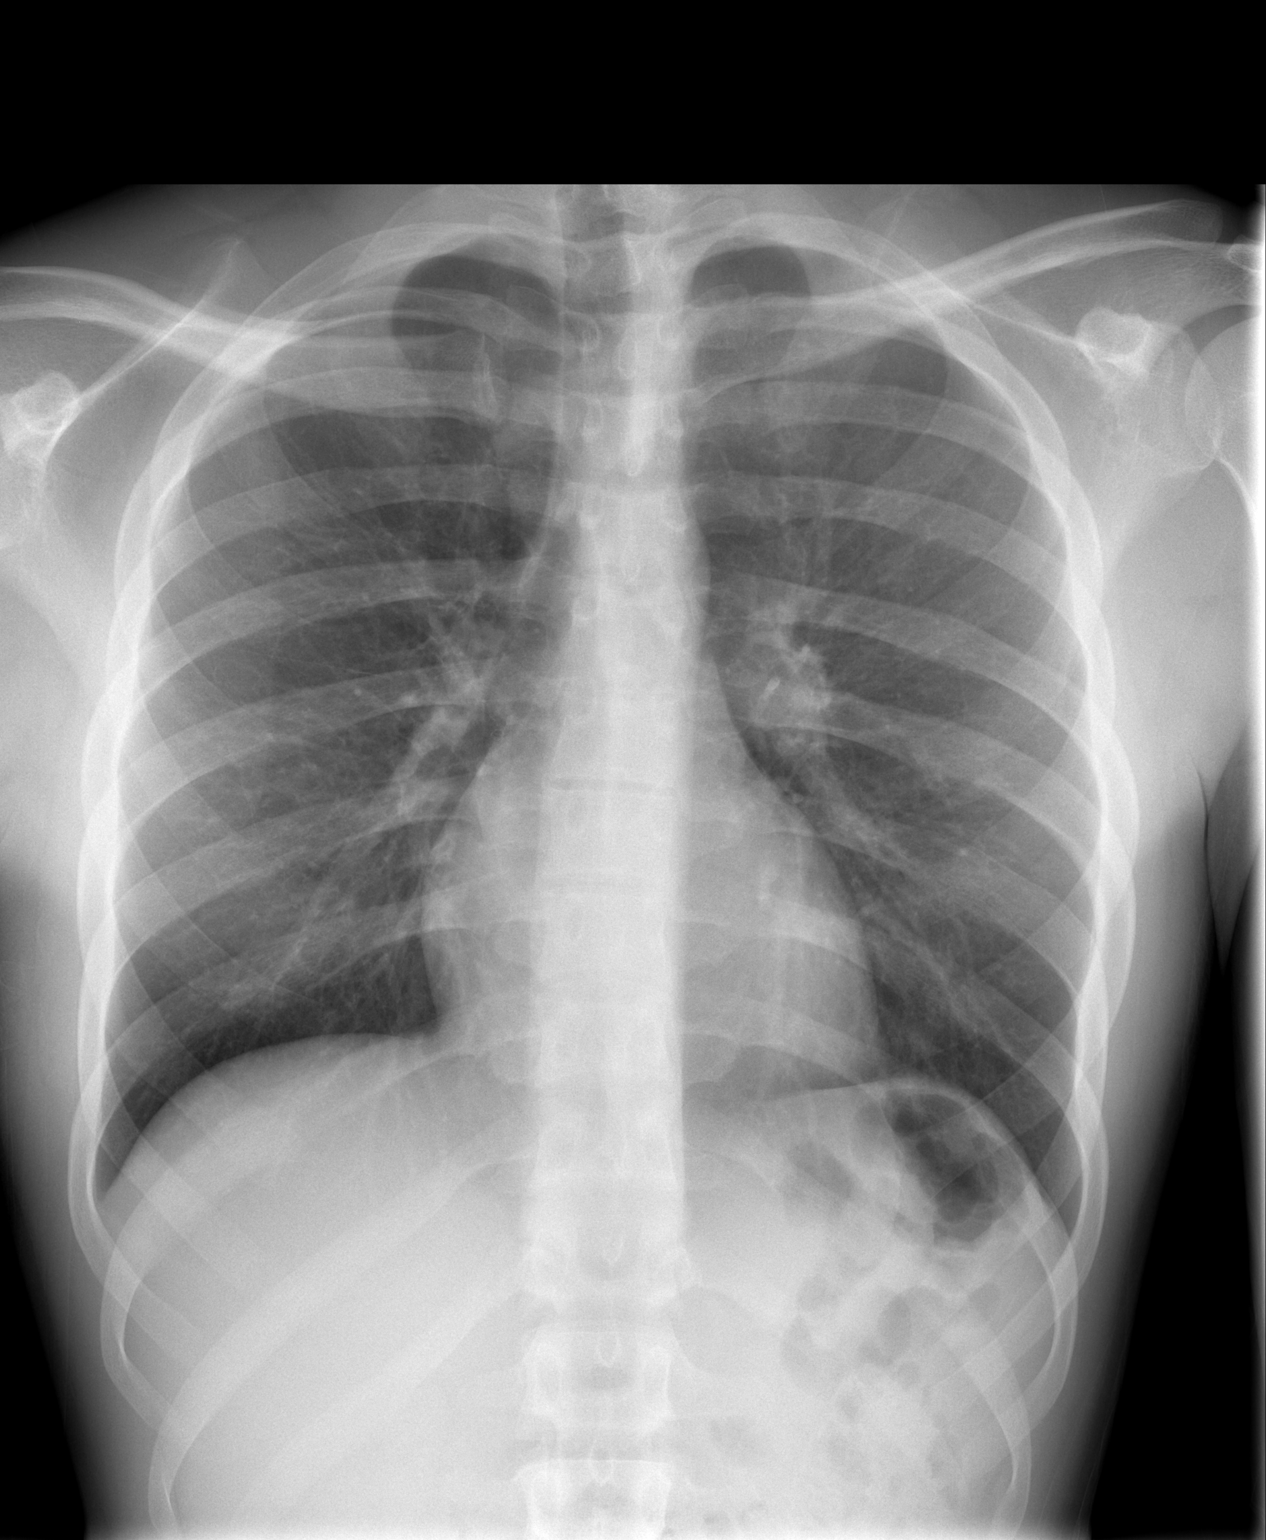

[w chest lat]
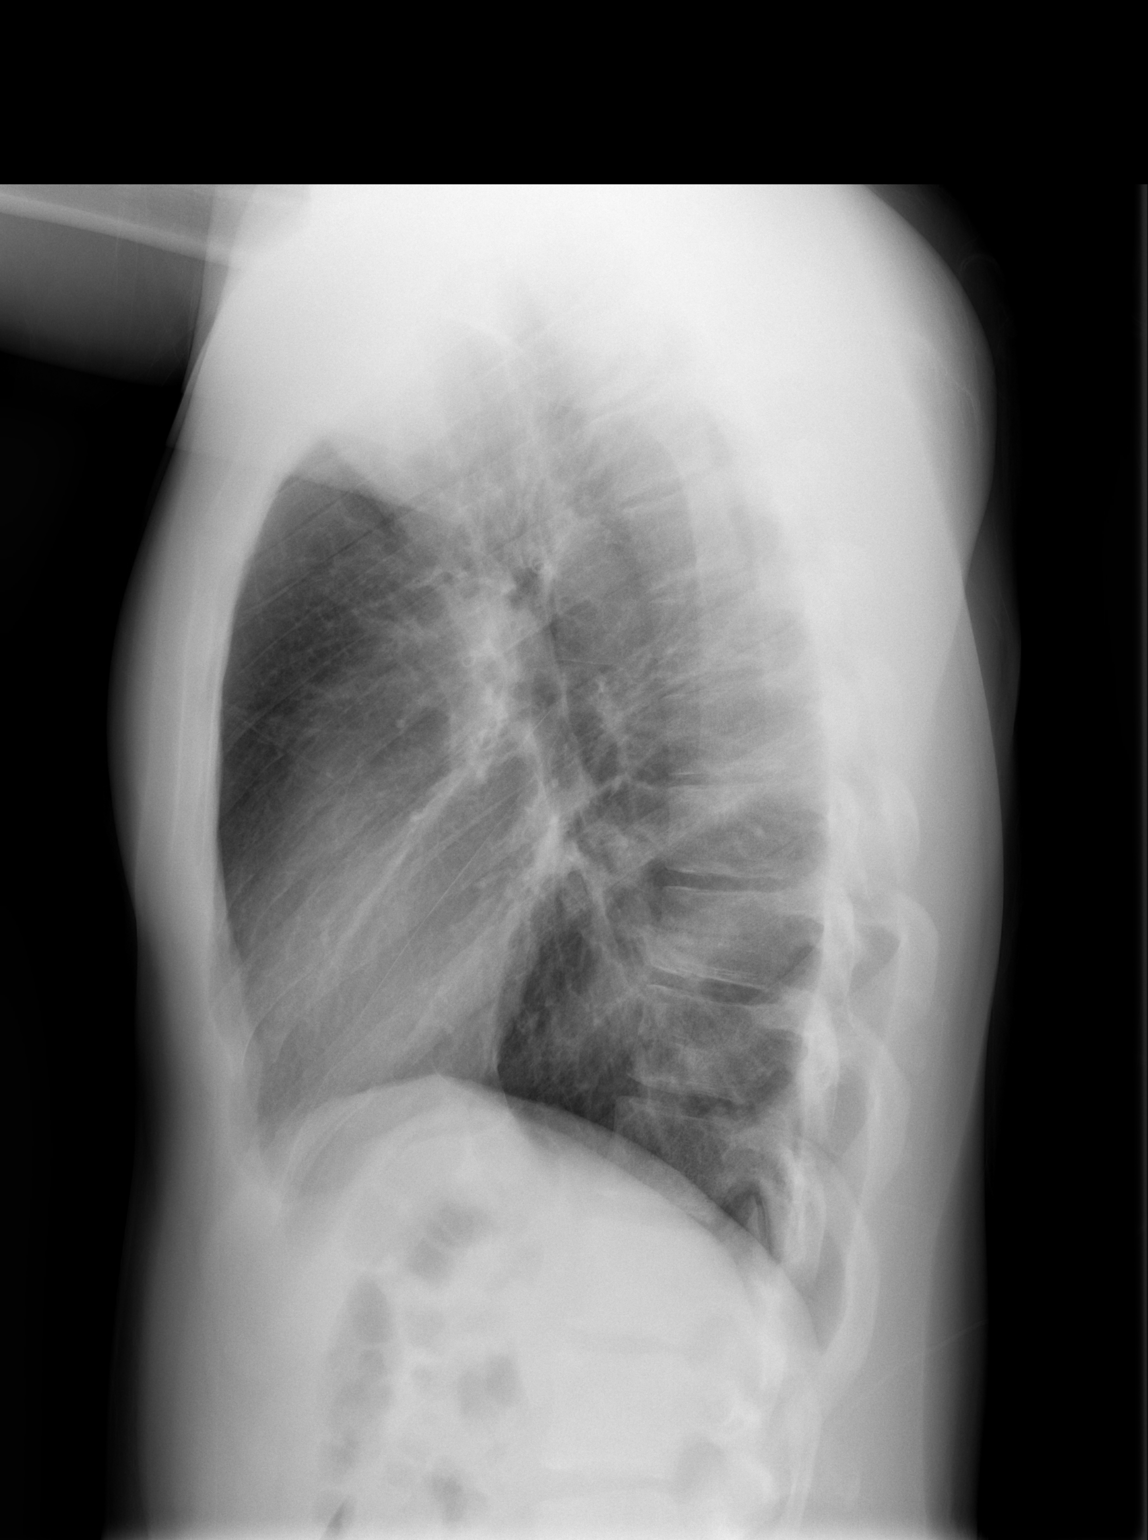

[2 of 2 positions shown; findings below may reference images not displayed]

FINDINGS: The heart size and mediastinal contours are within normal limits.
Both lungs are clear. There is a minimal upper thoracic scoliosis
centered at T5 with convexity to left, stable.
IMPRESSION: No significant abnormality.
